# Patient Record
Sex: Female | Born: 2000 | Hispanic: No | Marital: Single
Health system: Southern US, Community
[De-identification: ages and names within clinical notes are randomized; demographics above are authoritative.]

---
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
## 2021-01-15 ENCOUNTER — Telehealth: Payer: Self-pay

## 2021-01-15 NOTE — Patient Instructions (Signed)
Visit Information  Ms. Ruth Cook  - as a part of your Medicaid benefit, you are eligible for care management and care coordination services at no cost or copay. I was unable to reach you by phone today but would be happy to help you with your health related needs. Please feel free to call me @ [phone].   A member of the Managed Medicaid care management team will reach out to you again over the next 7 days.  Ernestina Joe, BSW, MHA [facility]  [place]  High Risk Managed Medicaid Team   

## 2021-01-15 NOTE — Patient Outreach (Signed)
Care Coordination  01/15/2021  Aastha Dayley 08-18-2001 [id number]   Medicaid Managed Care   Unsuccessful Outreach Note  01/15/2021 Name: Ruth Cook MRN: [id number] DOB: [birth year]  Referred by: No primary care provider on file. Reason for referral : High Risk Managed Medicaid (MM Screen Unsuccessful Telephone Outreach)   An unsuccessful telephone outreach was attempted today. The patient was referred to the case management team for assistance with care management and care coordination.   Follow Up Plan: The care management team will reach out to the patient again over the next 7 days.   Gus Puma, BSW, [place] [facility]  [place]  High Risk Managed Medicaid Team

## 2021-01-25 DIAGNOSIS — Z419 Encounter for procedure for purposes other than remedying health state, unspecified: Secondary | ICD-10-CM | POA: Diagnosis not present

## 2021-01-26 ENCOUNTER — Telehealth: Payer: Self-pay

## 2021-01-26 NOTE — Patient Outreach (Signed)
Care Coordination  01/26/2021  Ruth Cook [birth year] [id number]   Medicaid Managed Care   Unsuccessful Outreach Note  01/26/2021 Name: Ruth Cook MRN: [id number] DOB: 12-21-2000  Referred by: No primary care provider on file. Reason for referral : High Risk Managed Medicaid (Mm Screen Unsuccessful Telephone Outreach)   A second unsuccessful telephone outreach was attempted today. The patient was referred to the case management team for assistance with care management and care coordination.   Follow Up Plan: The care management team will reach out to the patient again over the next 7 days.   Gus Puma, BSW, [place] [facility]  [place]  High Risk Managed Medicaid Team

## 2021-01-26 NOTE — Patient Instructions (Signed)
Visit Information  Ms. Ruth Cook  - as a part of your Medicaid benefit, you are eligible for care management and care coordination services at no cost or copay. I was unable to reach you by phone today but would be happy to help you with your health related needs. Please feel free to call me @ [phone].   A member of the Managed Medicaid care management team will reach out to you again over the next 7 days.  Gus Puma, BSW, [place] [facility]  [place]  High Risk Managed Medicaid Team

## 2021-02-04 ENCOUNTER — Telehealth: Payer: Self-pay

## 2021-02-04 NOTE — Patient Outreach (Signed)
Care Coordination  02/04/2021  Ruth Cook 2001-07-16 [id number]   Medicaid Managed Care   Unsuccessful Outreach Note  02/04/2021 Name: Ruth Cook MRN: [id number] DOB: 2000/12/01  Referred by: No primary care provider on file. Reason for referral : High Risk Managed Medicaid (MM [facility] Unsuccessful Telephone Outreach)   Third unsuccessful telephone outreach was attempted today. The patient was referred to the case management team for assistance with care management and care coordination. The patient's primary care provider has been notified of our unsuccessful attempts to make or maintain contact with the patient. The care management team is pleased to engage with this patient at any time in the future should he/she be interested in assistance from the care management team.   Follow Up Plan: The patient has been provided with contact information for the care management team and has been advised to call with any health related questions or concerns.   Gus Puma, BSW, [place] [facility]  [employer] Risk Managed Medicaid Team  [phone]

## 2021-02-04 NOTE — Patient Instructions (Signed)
Visit Information  Ms. Danielys Madry  - as a part of your Medicaid benefit, you are eligible for care management and care coordination services at no cost or copay. I was unable to reach you by phone today but would be happy to help you with your health related needs. Please feel free to call me @ [phone].     Gus Puma, BSW, [place] [facility]  [employer] Risk Managed Medicaid Team  [phone]

## 2021-02-25 DIAGNOSIS — Z419 Encounter for procedure for purposes other than remedying health state, unspecified: Secondary | ICD-10-CM | POA: Diagnosis not present

## 2021-03-27 DIAGNOSIS — Z419 Encounter for procedure for purposes other than remedying health state, unspecified: Secondary | ICD-10-CM | POA: Diagnosis not present

## 2021-04-19 ENCOUNTER — Encounter (HOSPITAL_COMMUNITY): Payer: Self-pay

## 2021-04-19 ENCOUNTER — Other Ambulatory Visit: Payer: Self-pay

## 2021-04-19 ENCOUNTER — Emergency Department (HOSPITAL_COMMUNITY)
Admission: EM | Admit: 2021-04-19 | Discharge: 2021-04-19 | Disposition: A | Discharge status: Home / Self Care | Payer: Medicaid Other | Attending: Emergency Medicine | Admitting: Emergency Medicine

## 2021-04-19 ENCOUNTER — Emergency Department (HOSPITAL_COMMUNITY): Payer: Medicaid Other

## 2021-04-19 DIAGNOSIS — G4459 Other complicated headache syndrome: Secondary | ICD-10-CM | POA: Insufficient documentation

## 2021-04-19 DIAGNOSIS — M79639 Pain in unspecified forearm: Secondary | ICD-10-CM | POA: Insufficient documentation

## 2021-04-19 DIAGNOSIS — R42 Dizziness and giddiness: Secondary | ICD-10-CM | POA: Diagnosis not present

## 2021-04-19 DIAGNOSIS — Z79899 Other long term (current) drug therapy: Secondary | ICD-10-CM | POA: Insufficient documentation

## 2021-04-19 DIAGNOSIS — G43109 Migraine with aura, not intractable, without status migrainosus: Secondary | ICD-10-CM

## 2021-04-19 DIAGNOSIS — H532 Diplopia: Secondary | ICD-10-CM | POA: Diagnosis not present

## 2021-04-19 DIAGNOSIS — R519 Headache, unspecified: Secondary | ICD-10-CM | POA: Diagnosis not present

## 2021-04-19 LAB — COMPREHENSIVE METABOLIC PANEL
ALT: 14 U/L (ref 0–44)
AST: 16 U/L (ref 15–41)
Albumin: 4.3 g/dL (ref 3.5–5.0)
Alkaline Phosphatase: 58 U/L (ref 38–126)
Anion gap: 7 (ref 5–15)
BUN: 20 mg/dL (ref 6–20)
CO2: 28 mmol/L (ref 22–32)
Calcium: 9.8 mg/dL (ref 8.9–10.3)
Chloride: 104 mmol/L (ref 98–111)
Creatinine, Ser: 0.68 mg/dL (ref 0.44–1.00)
GFR, Estimated: >60 mL/min (ref >60)
Glucose, Bld: 111 mg/dL — ABNORMAL HIGH (ref 70–99)
Potassium: 3.8 mmol/L (ref 3.5–5.1)
Sodium: 139 mmol/L (ref 135–145)
Total Bilirubin: 0.8 mg/dL (ref 0.3–1.2)
Total Protein: 7.5 g/dL (ref 6.5–8.1)

## 2021-04-19 LAB — CBC WITH DIFFERENTIAL/PLATELET
Abs Immature Granulocytes: 0.02 10*3/uL (ref 0.00–0.07)
Basophils Absolute: 0 10*3/uL (ref 0.0–0.1)
Basophils Relative: 0 %
Eosinophils Absolute: 0 10*3/uL (ref 0.0–0.5)
Eosinophils Relative: 0 %
HCT: 42 % (ref 36.0–46.0)
Hemoglobin: 13.5 g/dL (ref 12.0–15.0)
Immature Granulocytes: 0 %
Lymphocytes Relative: 26 %
Lymphs Abs: 1.6 10*3/uL (ref 0.7–4.0)
MCH: 27 pg (ref 26.0–34.0)
MCHC: 32.1 g/dL (ref 30.0–36.0)
MCV: 84 fL (ref 80.0–100.0)
Monocytes Absolute: 0.5 10*3/uL (ref 0.1–1.0)
Monocytes Relative: 8 %
Neutro Abs: 4.2 10*3/uL (ref 1.7–7.7)
Neutrophils Relative %: 66 %
Platelets: 388 10*3/uL (ref 150–400)
RBC: 5 MIL/uL (ref 3.87–5.11)
RDW: 13.3 % (ref 11.5–15.5)
WBC: 6.4 10*3/uL (ref 4.0–10.5)
nRBC: 0 % (ref 0.0–0.2)

## 2021-04-19 LAB — I-STAT BETA HCG BLOOD, ED (MC, WL, AP ONLY): I-stat hCG, quantitative: <5 m[IU]/mL (ref <5)

## 2021-04-19 MED ORDER — DIPHENHYDRAMINE HCL 50 MG/ML IJ SOLN
12.5000 mg | Freq: Once | INTRAMUSCULAR | Status: AC
Start: 2021-04-19 — End: 2021-04-19
  Administered 2021-04-19: 12.5 mg via INTRAVENOUS
  Filled 2021-04-19: qty 1

## 2021-04-19 MED ORDER — PROCHLORPERAZINE EDISYLATE 10 MG/2ML IJ SOLN
10.0000 mg | Freq: Once | INTRAMUSCULAR | Status: AC
  Administered 2021-04-19: 10 mg via INTRAVENOUS
  Filled 2021-04-19: qty 2

## 2021-04-19 MED ORDER — GADOBUTROL 1 MMOL/ML IV SOLN
6.0000 mL | Freq: Once | INTRAVENOUS | Status: AC | PRN
  Administered 2021-04-19: 6 mL via INTRAVENOUS

## 2021-04-19 MED ORDER — IOHEXOL 350 MG/ML SOLN
75.0000 mL | Freq: Once | INTRAVENOUS | Status: AC | PRN
  Administered 2021-04-19: 75 mL via INTRAVENOUS

## 2021-04-19 MED ORDER — MECLIZINE HCL 25 MG PO TABS
25.0000 mg | ORAL_TABLET | Freq: Three times a day (TID) | ORAL | 0 refills | Status: AC | PRN
Start: 2021-04-19 — End: ?

## 2021-04-19 MED ORDER — GADOBUTROL 1 MMOL/ML IV SOLN: 6.0000 mL | Freq: Once | INTRAVENOUS | Status: DC | PRN

## 2021-04-19 MED ORDER — METOCLOPRAMIDE HCL 10 MG PO TABS: 10.0000 mg | ORAL_TABLET | Freq: Four times a day (QID) | ORAL | 0 refills | Status: AC

## 2021-04-19 MED ORDER — SODIUM CHLORIDE 0.9 % IV BOLUS
1000.0000 mL | Freq: Once | INTRAVENOUS | Status: AC
  Administered 2021-04-19: 1000 mL via INTRAVENOUS

## 2021-04-19 NOTE — ED Provider Notes (Signed)
[facility] EMERGENCY DEPARTMENT Provider Note   CSN: [id number] Arrival date & time: 04/19/21  1219     History Headache, vision changes   Ruth Cook is a 20 y.o. female with past medical history who presents for evaluation of headache.  Developed a frontal headache over the last 2 days as well as some dizziness.  She had drink an energy drink and thought symptoms were from that however symptoms did not resolve.  States she feels if she looks to the left she has blurred vision out of the left eye.  No pain to eye itself.  She is supposed to wear glasses however does not.  She rates her current headache a 6/10.  Noted this morning she had pain to her distal forearm.  No swelling, redness or warmth.  No traumatic injuries.  She denies any facial droop, difficulty speaking, neck rigidity, numbness, weakness.  No recent chiropractor or sports adjustments.  No known history of aneurysms or dissections in family.  Does not typically get headaches.  Denies additional aggravating or alleviating factors.  History obtained from patient and past medical records. No interpretor was used.  HPI     History reviewed. No pertinent past medical history.  There are no problems to display for this patient.   History reviewed. No pertinent surgical history.   OB History   No obstetric history on file.     No family history on file.     Home Medications Prior to Admission medications   Medication Sig Start Date End Date Taking? Authorizing Provider  meclizine (ANTIVERT) 25 MG tablet Take 1 tablet (25 mg total) by mouth 3 (three) times daily as needed for dizziness. 04/19/21  Yes Rosland Riding A, PA-C  metoCLOPramide (REGLAN) 10 MG tablet Take 1 tablet (10 mg total) by mouth every 6 (six) hours. 04/19/21  Yes Lorna Strother A, PA-C    Allergies    Patient has no allergy information on record.  Review of Systems   Review of Systems  Constitutional: Negative.    HENT: Negative.    Eyes:  Positive for visual disturbance.  Respiratory: Negative.    Cardiovascular: Negative.   Gastrointestinal: Negative.   Genitourinary: Negative.   Musculoskeletal: Negative.   Skin: Negative.   Neurological:  Positive for dizziness and headaches. Negative for tremors, seizures, speech difficulty, weakness, light-headedness and numbness.  All other systems reviewed and are negative.  Physical Exam Updated Vital Signs BP (!) 99/50 (BP Location: Right Arm)   Pulse 82   Temp 99.3 F (37.4 C) (Oral)   Resp 17   SpO2 98%   Physical Exam Vitals and nursing note reviewed.  Constitutional:      General: She is not in acute distress.    Appearance: She is well-developed. She is not ill-appearing, toxic-appearing or diaphoretic.  HENT:     Head: Normocephalic and atraumatic.     Nose: Nose normal.     Mouth/Throat:     Mouth: Mucous membranes are moist.  Eyes:     Extraocular Movements: Extraocular movements intact.     Conjunctiva/sclera: Conjunctivae normal.     Pupils: Pupils are equal, round, and reactive to light.     Visual Fields: Right eye visual fields normal and left eye visual fields normal.     Comments: Visual fields intact to bilateral eyes and upper quadrant  Cardiovascular:     Rate and Rhythm: Normal rate.     Pulses: Normal pulses.  Heart sounds: Normal heart sounds.  Pulmonary:     Effort: Pulmonary effort is normal. No respiratory distress.     Breath sounds: Normal breath sounds.  Abdominal:     General: Bowel sounds are normal. There is no distension.     Palpations: Abdomen is soft.  Musculoskeletal:        General: No swelling, tenderness, deformity or signs of injury. Normal range of motion.     Cervical back: Normal range of motion.     Right lower leg: No edema.     Left lower leg: No edema.     Comments: No bony tenderness.  Moves all 4 extremities without difficulty.  Skin:    General: Skin is warm and dry.      Capillary Refill: Capillary refill takes less than 2 seconds.     Comments: No edema, erythema or warmth.  Neurological:     General: No focal deficit present.     Mental Status: She is alert and oriented to person, place, and time.     Cranial Nerves: Cranial nerves are intact.     Sensory: Sensation is intact.     Motor: Motor function is intact.     Gait: Gait is intact.     Comments: CN 2-12 grossly intact No facial droop Equal hand grip bilaterally Intact sensation bilaterally Ambulatory without ataxic gait Visional field intact bilaterally  Psychiatric:        Mood and Affect: Mood normal.    ED Results / Procedures / Treatments   Labs (all labs ordered are listed, but only abnormal results are displayed) Labs Reviewed  COMPREHENSIVE METABOLIC PANEL - Abnormal; Notable for the following components:      Result Value   Glucose, Bld 111 (*)    All other components within normal limits  CBC WITH DIFFERENTIAL/PLATELET  I-STAT BETA HCG BLOOD, ED ([facility], [facility], [facility] ONLY)    EKG None  Radiology CT Angio Head W or Wo Contrast  Result Date: 04/19/2021 CLINICAL DATA:  Dizziness and left visual field cut EXAM: CT ANGIOGRAPHY HEAD AND NECK TECHNIQUE: Multidetector CT imaging of the head and neck was performed using the standard protocol during bolus administration of intravenous contrast. Multiplanar CT image reconstructions and MIPs were obtained to evaluate the vascular anatomy. Carotid stenosis measurements (when applicable) are obtained utilizing NASCET criteria, using the distal internal carotid diameter as the denominator. CONTRAST:  68mL OMNIPAQUE IOHEXOL 350 MG/ML SOLN COMPARISON:  None. FINDINGS: CT HEAD FINDINGS Brain: There is no mass, hemorrhage or extra-axial collection. The size and configuration of the ventricles and extra-axial CSF spaces are normal. There is no acute or chronic infarction. The brain parenchyma is normal. Skull: The visualized skull base, calvarium and  extracranial soft tissues are normal. Sinuses/Orbits: No fluid levels or advanced mucosal thickening of the visualized paranasal sinuses. No mastoid or middle ear effusion. The orbits are normal. CTA NECK FINDINGS SKELETON: There is no bony spinal canal stenosis. No lytic or blastic lesion. OTHER NECK: Normal pharynx, larynx and major salivary glands. No cervical lymphadenopathy. Unremarkable thyroid gland. UPPER CHEST: No pneumothorax or pleural effusion. No nodules or masses. AORTIC ARCH: There is no calcific atherosclerosis of the aortic arch. There is no aneurysm, dissection or hemodynamically significant stenosis of the visualized portion of the aorta. Normal variant aortic arch branching pattern with the left vertebral artery arising independently from the aortic arch. The visualized proximal subclavian arteries are widely patent. RIGHT CAROTID SYSTEM: Normal without aneurysm, dissection or stenosis.  LEFT CAROTID SYSTEM: Normal without aneurysm, dissection or stenosis. VERTEBRAL ARTERIES: Left dominant configuration. Both origins are clearly patent. There is no dissection, occlusion or flow-limiting stenosis to the skull base (V1-V3 segments). CTA HEAD FINDINGS POSTERIOR CIRCULATION: --Vertebral arteries: Normal V4 segments. --Inferior cerebellar arteries: Normal. --Basilar artery: Normal. --Superior cerebellar arteries: Normal. --Posterior cerebral arteries (PCA): Normal. ANTERIOR CIRCULATION: --Intracranial internal carotid arteries: Normal. --Anterior cerebral arteries (ACA): Normal. Both A1 segments are present. Patent anterior communicating artery (a-comm). --Middle cerebral arteries (MCA): Normal. VENOUS SINUSES: As permitted by contrast timing, patent. ANATOMIC VARIANTS: None Review of the MIP images confirms the above findings. IMPRESSION: Normal CTA of the head and neck. Electronically Signed   By: Deatra Robinson M.D.   On: 04/19/2021 19:02   CT Head Wo Contrast  Result Date: 04/19/2021 CLINICAL  DATA:  Dizziness. EXAM: CT HEAD WITHOUT CONTRAST TECHNIQUE: Contiguous axial images were obtained from the base of the skull through the vertex without intravenous contrast. COMPARISON:  None. FINDINGS: Brain: No evidence of acute infarction, hemorrhage, hydrocephalus, extra-axial collection or mass lesion/mass effect. Vascular: No hyperdense vessel or unexpected calcification. Skull: Normal. Negative for fracture or focal lesion. Sinuses/Orbits: No acute finding. Other: None. IMPRESSION: No acute intracranial abnormality seen. Electronically Signed   By: Lupita Raider M.D.   On: 04/19/2021 13:36   CT Angio Neck W and/or Wo Contrast  Result Date: 04/19/2021 CLINICAL DATA:  Dizziness and left visual field cut EXAM: CT ANGIOGRAPHY HEAD AND NECK TECHNIQUE: Multidetector CT imaging of the head and neck was performed using the standard protocol during bolus administration of intravenous contrast. Multiplanar CT image reconstructions and MIPs were obtained to evaluate the vascular anatomy. Carotid stenosis measurements (when applicable) are obtained utilizing NASCET criteria, using the distal internal carotid diameter as the denominator. CONTRAST:  75mL OMNIPAQUE IOHEXOL 350 MG/ML SOLN COMPARISON:  None. FINDINGS: CT HEAD FINDINGS Brain: There is no mass, hemorrhage or extra-axial collection. The size and configuration of the ventricles and extra-axial CSF spaces are normal. There is no acute or chronic infarction. The brain parenchyma is normal. Skull: The visualized skull base, calvarium and extracranial soft tissues are normal. Sinuses/Orbits: No fluid levels or advanced mucosal thickening of the visualized paranasal sinuses. No mastoid or middle ear effusion. The orbits are normal. CTA NECK FINDINGS SKELETON: There is no bony spinal canal stenosis. No lytic or blastic lesion. OTHER NECK: Normal pharynx, larynx and major salivary glands. No cervical lymphadenopathy. Unremarkable thyroid gland. UPPER CHEST: No  pneumothorax or pleural effusion. No nodules or masses. AORTIC ARCH: There is no calcific atherosclerosis of the aortic arch. There is no aneurysm, dissection or hemodynamically significant stenosis of the visualized portion of the aorta. Normal variant aortic arch branching pattern with the left vertebral artery arising independently from the aortic arch. The visualized proximal subclavian arteries are widely patent. RIGHT CAROTID SYSTEM: Normal without aneurysm, dissection or stenosis. LEFT CAROTID SYSTEM: Normal without aneurysm, dissection or stenosis. VERTEBRAL ARTERIES: Left dominant configuration. Both origins are clearly patent. There is no dissection, occlusion or flow-limiting stenosis to the skull base (V1-V3 segments). CTA HEAD FINDINGS POSTERIOR CIRCULATION: --Vertebral arteries: Normal V4 segments. --Inferior cerebellar arteries: Normal. --Basilar artery: Normal. --Superior cerebellar arteries: Normal. --Posterior cerebral arteries (PCA): Normal. ANTERIOR CIRCULATION: --Intracranial internal carotid arteries: Normal. --Anterior cerebral arteries (ACA): Normal. Both A1 segments are present. Patent anterior communicating artery (a-comm). --Middle cerebral arteries (MCA): Normal. VENOUS SINUSES: As permitted by contrast timing, patent. ANATOMIC VARIANTS: None Review of the MIP images confirms the above  findings. IMPRESSION: Normal CTA of the head and neck. Electronically Signed   By: Deatra Robinson M.D.   On: 04/19/2021 19:02   MR Brain W and Wo Contrast  Result Date: 04/19/2021 CLINICAL DATA:  Headache, dizziness and diplopia EXAM: MRI HEAD AND ORBITS WITHOUT AND WITH CONTRAST TECHNIQUE: Multiplanar, multiecho pulse sequences of the brain and surrounding structures were obtained without and with intravenous contrast. Multiplanar, multiecho pulse sequences of the orbits and surrounding structures were obtained including fat saturation techniques, before and after intravenous contrast administration.  CONTRAST:  21mL GADAVIST GADOBUTROL 1 MMOL/ML IV SOLN COMPARISON:  None. FINDINGS: MRI HEAD FINDINGS Brain: No acute infarct, mass effect or extra-axial collection. No acute or chronic hemorrhage. Normal white matter signal, parenchymal volume and CSF spaces. The midline structures are normal. Vascular: Major flow voids are preserved. Skull and upper cervical spine: Normal calvarium and skull base. Visualized upper cervical spine and soft tissues are normal. MRI ORBITS FINDINGS Orbits: --Globes: Normal. --Bony orbit: Normal. --Preseptal soft tissues: Normal. --Intra- and extraconal orbital fat: Normal. No inflammatory stranding. --Optic nerves: Normal. --Lacrimal glands and fossae: Normal. --Extraocular muscles: Normal. Visualized sinuses:  No fluid levels or advanced mucosal thickening. Soft tissues: Normal. IMPRESSION: Normal MRI of the brain and orbits. Electronically Signed   By: Deatra Robinson M.D.   On: 04/19/2021 22:17   MR ORBITS W WO CONTRAST  Result Date: 04/19/2021 CLINICAL DATA:  Headache, dizziness and diplopia EXAM: MRI HEAD AND ORBITS WITHOUT AND WITH CONTRAST TECHNIQUE: Multiplanar, multiecho pulse sequences of the brain and surrounding structures were obtained without and with intravenous contrast. Multiplanar, multiecho pulse sequences of the orbits and surrounding structures were obtained including fat saturation techniques, before and after intravenous contrast administration. CONTRAST:  50mL GADAVIST GADOBUTROL 1 MMOL/ML IV SOLN COMPARISON:  None. FINDINGS: MRI HEAD FINDINGS Brain: No acute infarct, mass effect or extra-axial collection. No acute or chronic hemorrhage. Normal white matter signal, parenchymal volume and CSF spaces. The midline structures are normal. Vascular: Major flow voids are preserved. Skull and upper cervical spine: Normal calvarium and skull base. Visualized upper cervical spine and soft tissues are normal. MRI ORBITS FINDINGS Orbits: --Globes: Normal. --Bony orbit:  Normal. --Preseptal soft tissues: Normal. --Intra- and extraconal orbital fat: Normal. No inflammatory stranding. --Optic nerves: Normal. --Lacrimal glands and fossae: Normal. --Extraocular muscles: Normal. Visualized sinuses:  No fluid levels or advanced mucosal thickening. Soft tissues: Normal. IMPRESSION: Normal MRI of the brain and orbits. Electronically Signed   By: Deatra Robinson M.D.   On: 04/19/2021 22:17    Procedures Procedures   Medications Ordered in ED Medications  gadobutrol (GADAVIST) 1 MMOL/ML injection 6 mL (has no administration in time range)  prochlorperazine (COMPAZINE) injection 10 mg (10 mg Intravenous Given 04/19/21 1757)  diphenhydrAMINE (BENADRYL) injection 12.5 mg (12.5 mg Intravenous Given 04/19/21 1757)  sodium chloride 0.9 % bolus 1,000 mL (0 mLs Intravenous Stopped 04/19/21 1930)  iohexol (OMNIPAQUE) 350 MG/ML injection 75 mL (75 mLs Intravenous Contrast Given 04/19/21 1815)  gadobutrol (GADAVIST) 1 MMOL/ML injection 6 mL (6 mLs Intravenous Contrast Given 04/19/21 2206)   ED Course  I have reviewed the triage vital signs and the nursing notes.  Pertinent labs & imaging results that were available during my care of the patient were reviewed by me and considered in my medical decision making (see chart for details).  Her for evaluation of HA, dizziness and left vision changes. Afebrile, non septic non ill appearing.  Headache generalized in nature however worse to left  head.  She is nontender over her temples.  Denies any eye pain.  No discharge to eyes bilaterally.  No neck stiffness or neck rigidity.  She has a nonfocal neuro exam without deficits.  Does admit to some blurred vision in her left eye when she looks to the left.  She has intact visual field to all 4 quadrants bilaterally on my initial exam.  Plan on CTA head/neck, migraine cocktail. No history AAA, dissection, MS in family.    Labs and imaging personally reviewed and interpreted:  CT head without acute  abnormality CBC without leucocytosis CMP glucose at 111 Preg negative CTA head and neck without acute abnormality  Patient reassessed.  Headache resolved.  Discussed recommendations from neurology.  MRI without significant abnormality.  Likely complicated migraine.  DC home with symptomatic management.  Low suspicion for acute intraocular abnormality as cause of symptoms. Low suspicion for meningitis, SAH, ICH, Meningitis, acute angle glaucoma, retinal detachment or temporal arteritis.   The patient has been appropriately medically screened and/or stabilized in the ED. I have low suspicion for any other emergent medical condition which would require further screening, evaluation or treatment in the ED or require inpatient management.  Patient is hemodynamically stable and in no acute distress.  Patient able to ambulate in department prior to ED.  Evaluation does not show acute pathology that would require ongoing or additional emergent interventions while in the emergency department or further inpatient treatment.  I have discussed the diagnosis with the patient and answered all questions.  Pain is been managed while in the emergency department and patient has no further complaints prior to discharge.  Patient is comfortable with plan discussed in room and is stable for discharge at this time.  I have discussed strict return precautions for returning to the emergency department.  Patient was encouraged to follow-up with PCP/specialist refer to at discharge.   Clinical Course as of 04/19/21 2320  Wynelle LinkSun Apr 19, 2021  1942 Headache resolved.   CONSULT with Dr. Derry LoryKhaliqdina with Neuro who recommends MR brain and orbits to r/o MS. If neg can dc home with outpatient FU [BH]  2253 MR without acute findings [BH]    Clinical Course User Index [BH] Brighten Orndoff A, PA-C   MDM Rules/Calculators/A&P                            Final Clinical Impression(s) / ED Diagnoses Final diagnoses:  Complicated  migraine    Rx / DC Orders ED Discharge Orders          Ordered    meclizine (ANTIVERT) 25 MG tablet  3 times daily PRN        04/19/21 2315    metoCLOPramide (REGLAN) 10 MG tablet  Every 6 hours        04/19/21 2315             Rabia Argote A, PA-C 04/19/21 2320    Jacalyn LefevreHaviland, Julie, MD 04/19/21 2326

## 2021-04-19 NOTE — ED Notes (Signed)
Pt discharged and ambulated out of the ED without difficulty. 

## 2021-04-19 NOTE — Discharge Instructions (Addendum)
May take meclizine and reglan at home for headache. Follow up with eye doctors  Return for new or worsening symptoms

## 2021-04-19 NOTE — ED Provider Notes (Signed)
Emergency Medicine Provider Triage Evaluation Note  Ruth Cook , a 20 y.o. female  was evaluated in triage.  Pt complains of headache, dizziness, left arm pain.  Dizziness began first.  She states when she looks to the left, she has increased dizziness.  When she stands up she feels off balance and the room is spinning.  Frontal headache.  Left arm pain began today.  Review of Systems  Positive: Ha, dizziness, l arm pain Negative: cp  Physical Exam  BP 135/81   Pulse (!) 116   Temp 99.3 F (37.4 C) (Oral)   Resp 14   SpO2 99%  Gen:   Awake, no distress   Resp:  Normal effort  MSK:   Moves extremities without difficulty  Other:  EOMI, strength and sensation of bilat upper ext  Medical Decision Making  Medically screening exam initiated at 1:04 PM.  Appropriate orders placed.  Ruth Cook was informed that the remainder of the evaluation will be completed by another provider, this initial triage assessment does not replace that evaluation, and the importance of remaining in the ED until their evaluation is complete.  Ct head and labs   Alveria Apley, PA-C 04/19/21 1305    Cheryll Cockayne, MD 04/21/21 0730

## 2021-04-19 NOTE — ED Triage Notes (Signed)
Patient complains of dizziness and left arm pain with headache since Thursday. States that she thinks related to drinking 2 energy drinks. Describes that headache as mild

## 2021-04-20 ENCOUNTER — Telehealth: Payer: Self-pay

## 2021-04-20 NOTE — Telephone Encounter (Addendum)
Transition Care Management Unsuccessful Follow-up Telephone Call  Date of discharge and from where:  04/19/2021-[facility]  Attempts:  1st Attempt  Reason for unsuccessful TCM follow-up call:  Left voice message

## 2021-04-21 NOTE — Telephone Encounter (Signed)
Transition Care Management Unsuccessful Follow-up Telephone Call  Date of discharge and from where:  04/19/2021-[facility]  Attempts:  2nd Attempt  Reason for unsuccessful TCM follow-up call:  Left voice message

## 2021-04-22 NOTE — Telephone Encounter (Signed)
Transition Care Management Unsuccessful Follow-up Telephone Call  Date of discharge and from where:  04/19/2021-[facility]   Attempts:  3rd Attempt  Reason for unsuccessful TCM follow-up call:  Left voice message

## 2022-03-21 IMAGING — MR MR ORBITS WO/W CM
4 of 6 series · 19 of 48 positions shown · IV contrast (6 ML gad)
Comparison: None.

CLINICAL DATA: Headache, dizziness and diplopia

EXAM:
MRI HEAD AND ORBITS WITHOUT AND WITH CONTRAST
TECHNIQUE: Multiplanar, multiecho pulse sequences of the brain and surrounding
structures were obtained without and with intravenous contrast.
Multiplanar, multiecho pulse sequences of the orbits and surrounding
structures were obtained including fat saturation techniques, before
and after intravenous contrast administration.
CONTRAST:  6mL GADAVIST GADOBUTROL 1 MMOL/ML IV SOLN

[Series 9: T2 fat-sat · coronal · 4.0mm · 0.35mm/px · 8 of 22 slices shown (1 of 2)]
[im 1/22]
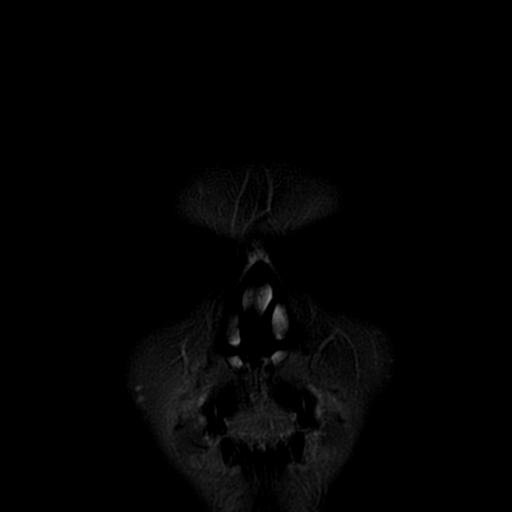
[im 4/22]
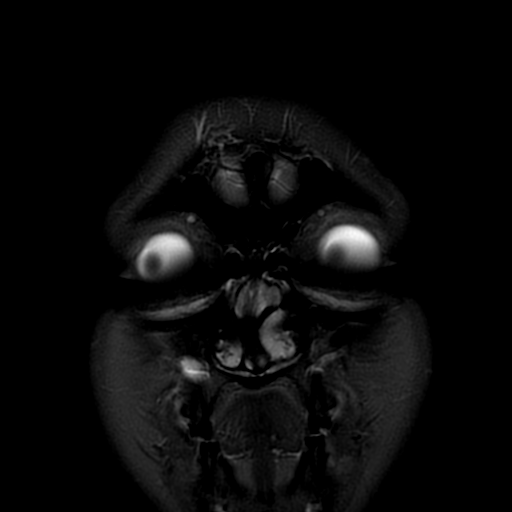
[im 7/22]
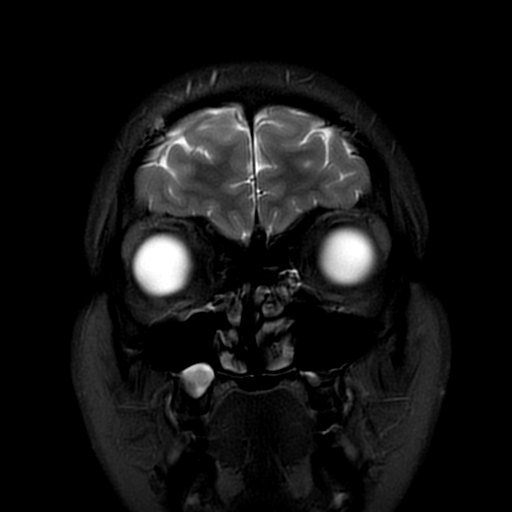
[im 10/22]
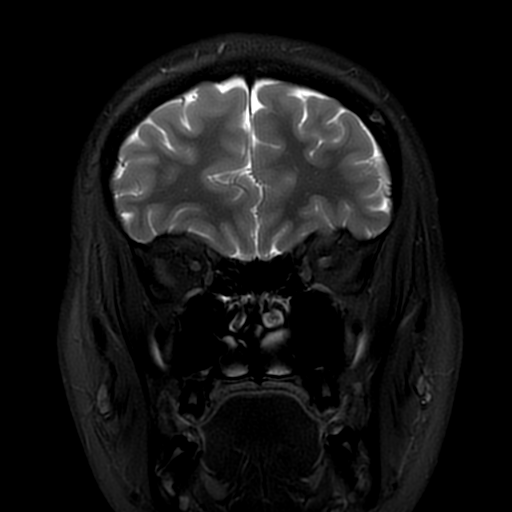
[im 13/22]
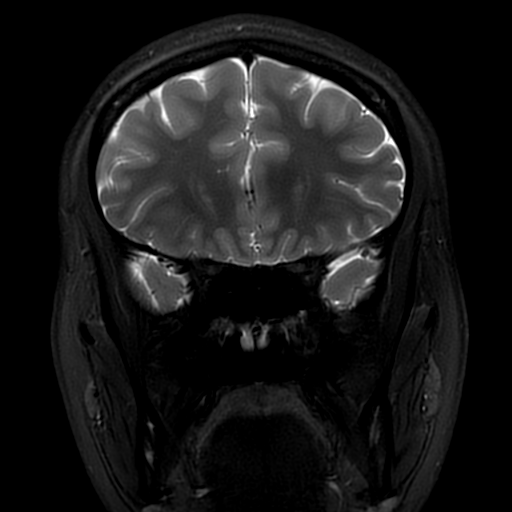
[im 16/22]
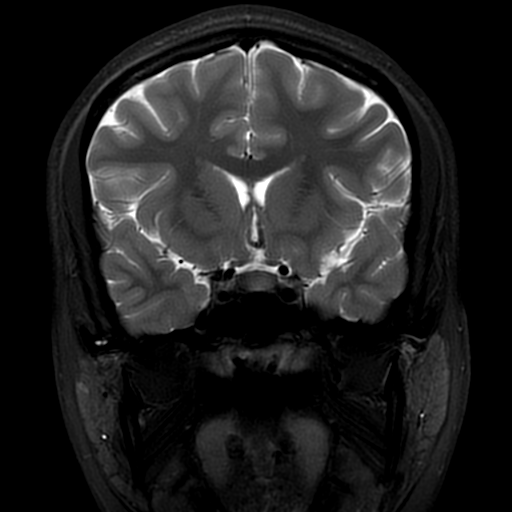
[im 19/22]
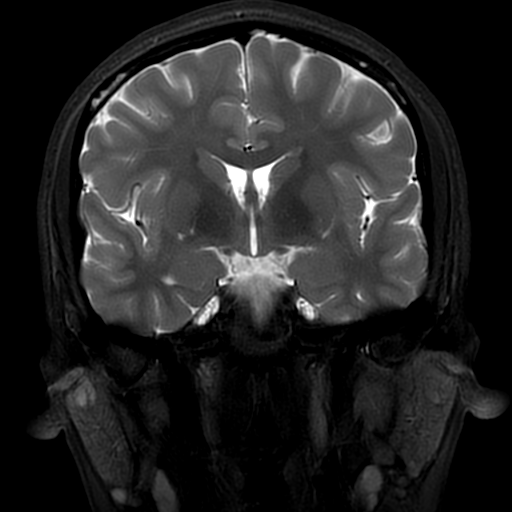
[im 22/22]
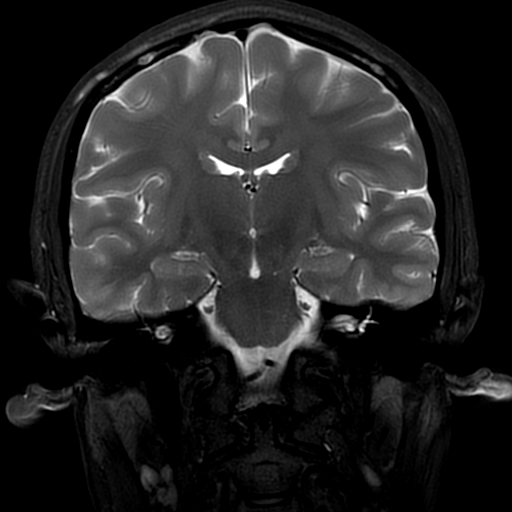

[Series 10: T2 fat-sat · axial · 3.0mm · 0.35mm/px · z∈[-84,-40]mm · 5 of 20 slices shown (2 of 2)]
[im 1/20]
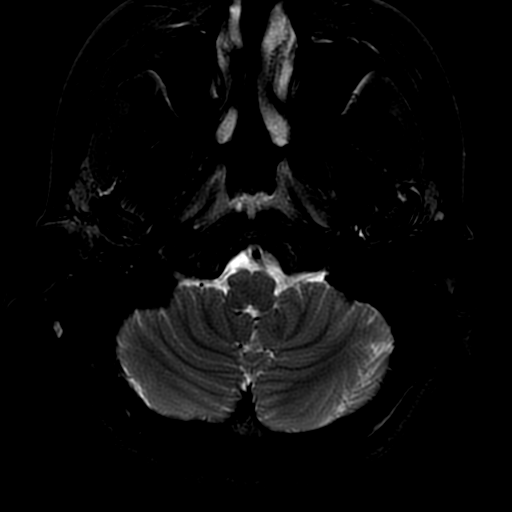
[im 3/20]
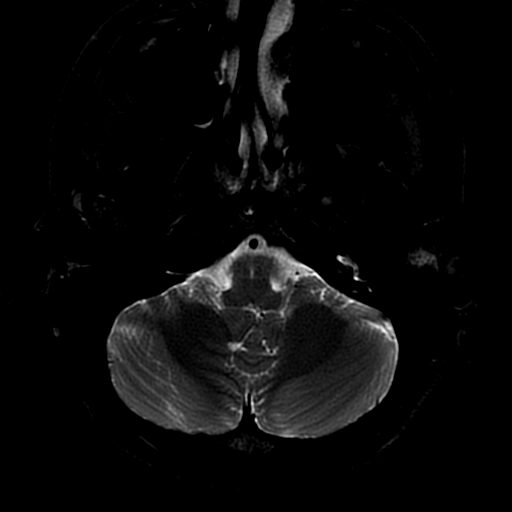
[im 6/20]
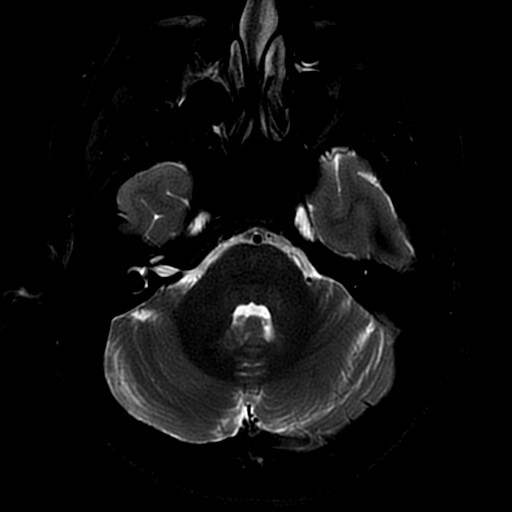
[im 11/20]
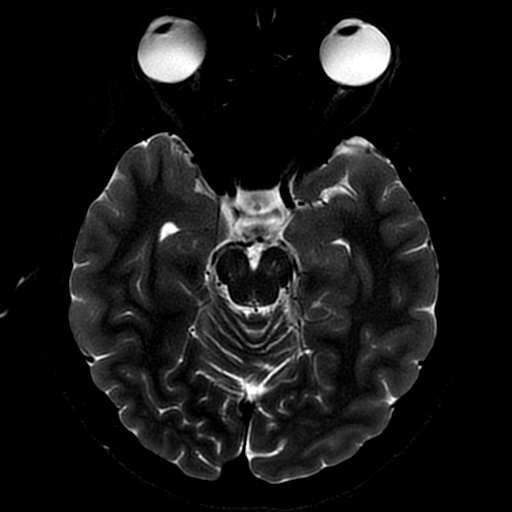
[im 17/20]
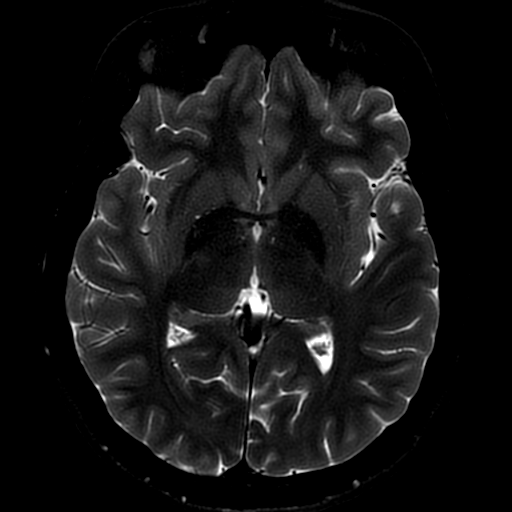

[Series 11: T1 · coronal · 4.0mm · 0.35mm/px · 3 of 22 slices shown (1 of 2)]
[im 4/22]
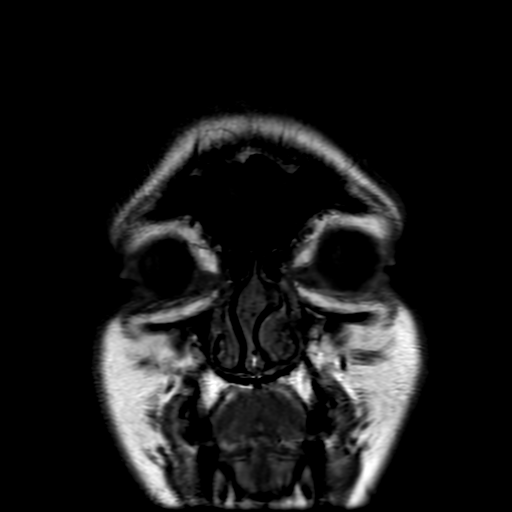
[im 13/22]
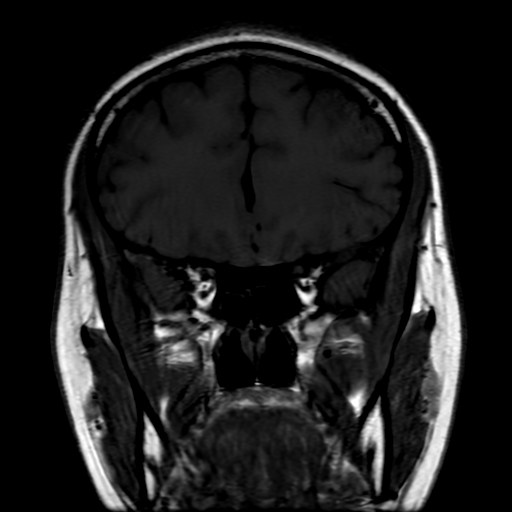
[im 19/22]
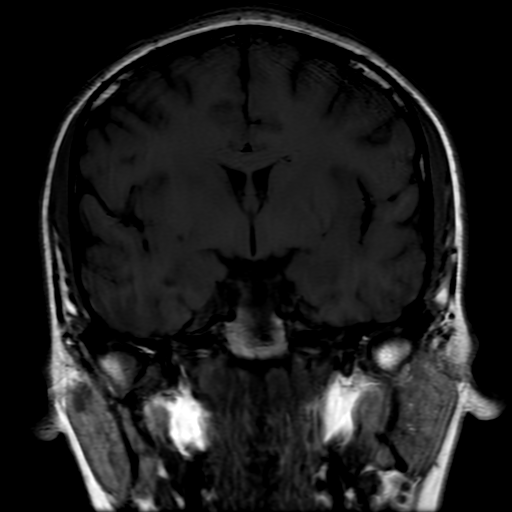

[Series 12: T1 · axial · 3.0mm · 0.35mm/px · z∈[-80,-42]mm · 3 of 20 slices shown (2 of 2)]
[im 3/20]
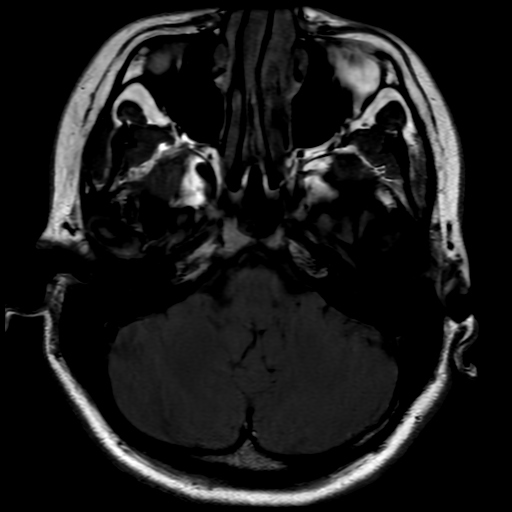
[im 11/20]
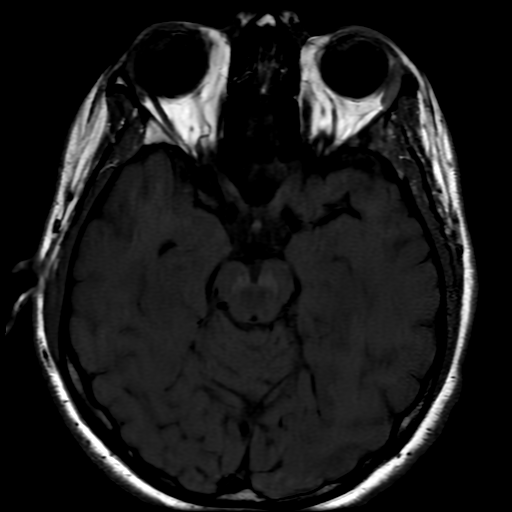
[im 17/20]
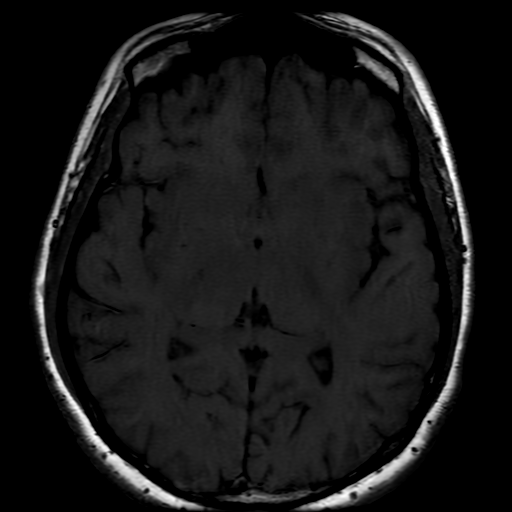

[19 of 48 positions shown; findings below may reference images not displayed]

FINDINGS: MRI HEAD FINDINGS

Brain: No acute infarct, mass effect or extra-axial collection. No
acute or chronic hemorrhage. Normal white matter signal, parenchymal
volume and CSF spaces. The midline structures are normal.

Vascular: Major flow voids are preserved.

Skull and upper cervical spine: Normal calvarium and skull base.
Visualized upper cervical spine and soft tissues are normal.

MRI ORBITS FINDINGS

Orbits:

--Globes: Normal.

--Bony orbit: Normal.

--Preseptal soft tissues: Normal.

--Intra- and extraconal orbital fat: Normal. No inflammatory
stranding.

--Optic nerves: Normal.

--Lacrimal glands and fossae: Normal.

--Extraocular muscles: Normal.

Visualized sinuses:  No fluid levels or advanced mucosal thickening.

Soft tissues: Normal.
IMPRESSION: Normal MRI of the brain and orbits.
